# Patient Record
Sex: Female | Born: 1940 | Race: White | Hispanic: No | Marital: Married | Smoking: Never smoker
Health system: Southern US, Community
[De-identification: ages and names within clinical notes are randomized; demographics above are authoritative.]

## PROBLEM LIST (undated history)

## (undated) DIAGNOSIS — R030 Elevated blood-pressure reading, without diagnosis of hypertension: Secondary | ICD-10-CM

## (undated) DIAGNOSIS — G40909 Epilepsy, unspecified, not intractable, without status epilepticus: Secondary | ICD-10-CM

## (undated) DIAGNOSIS — K5900 Constipation, unspecified: Secondary | ICD-10-CM

## (undated) DIAGNOSIS — K829 Disease of gallbladder, unspecified: Secondary | ICD-10-CM

## (undated) DIAGNOSIS — N3281 Overactive bladder: Secondary | ICD-10-CM

## (undated) DIAGNOSIS — M810 Age-related osteoporosis without current pathological fracture: Secondary | ICD-10-CM

## (undated) DIAGNOSIS — J387 Other diseases of larynx: Secondary | ICD-10-CM

## (undated) HISTORY — PX: TONSILLECTOMY: SUR1361

---

## 2008-08-13 HISTORY — PX: LARYNX SURGERY: SHX692

## 2018-07-31 ENCOUNTER — Other Ambulatory Visit: Payer: Self-pay | Admitting: Gastroenterology

## 2018-07-31 ENCOUNTER — Other Ambulatory Visit (HOSPITAL_COMMUNITY): Payer: Self-pay | Admitting: Gastroenterology

## 2018-07-31 DIAGNOSIS — R109 Unspecified abdominal pain: Secondary | ICD-10-CM

## 2018-08-20 ENCOUNTER — Ambulatory Visit (HOSPITAL_COMMUNITY)
Admission: RE | Admit: 2018-08-20 | Discharge: 2018-08-20 | Disposition: A | Discharge status: Home / Self Care | Payer: Medicare Other | Source: Ambulatory Visit | Attending: Gastroenterology | Admitting: Gastroenterology

## 2018-08-20 ENCOUNTER — Encounter (HOSPITAL_COMMUNITY): Payer: Self-pay

## 2018-08-20 DIAGNOSIS — R109 Unspecified abdominal pain: Secondary | ICD-10-CM | POA: Diagnosis present

## 2018-08-20 MED ORDER — TECHNETIUM TC 99M MEBROFENIN IV KIT
5.0000 | PACK | Freq: Once | INTRAVENOUS | Status: AC | PRN
  Administered 2018-08-20: 5 via INTRAVENOUS

## 2018-09-02 NOTE — Progress Notes (Signed)
Please place orders in Epic as this patient is being scheduled for a pre-op appointment! Thank you!

## 2018-09-11 NOTE — Patient Instructions (Addendum)
Michele Levy  09/11/2018   Your procedure is scheduled on: 09-16-2018   Report to [facility] Main  Entrance     Report to admitting at 5:30AM    Call this number if you have problems the morning of surgery [phone]   PLEASE REMEMBER TO BRING COPY OF YOUR LIVING WILL DAY OF SURGERY      Remember: Do not eat food or drink liquids :After Midnight. BRUSH YOUR TEETH MORNING OF SURGERY AND RINSE YOUR MOUTH OUT, NO CHEWING GUM CANDY OR MINTS.     Take these medicines the morning of surgery with A SIP OF WATER: phenytoin,  phenobarbital                                  You may not have any metal on your body including hair pins and              piercings  Do not wear jewelry, make-up, lotions, powders or perfumes, deodorant             Do not wear nail polish.  Do not shave  48 hours prior to surgery.                Do not bring valuables to the hospital. [name] IS NOT             RESPONSIBLE   FOR VALUABLES.  Contacts, dentures or bridgework may not be worn into surgery.      Patients discharged the day of surgery will not be allowed to drive home. IF YOU ARE HAVING SURGERY AND GOING HOME THE SAME DAY, YOU MUST HAVE AN ADULT TO DRIVE YOU HOME AND BE WITH YOU FOR 24 HOURS. YOU MAY GO HOME BY TAXI OR UBER OR ORTHERWISE, BUT AN ADULT MUST ACCOMPANY YOU HOME AND STAY WITH YOU FOR 24 HOURS.  Name and phone number of your driver:  Special Instructions: N/A              Please read over the following fact sheets you were given: _____________________________________________________________________             [facility] - Preparing for Surgery Before surgery, you can play an important role.  Because skin is not sterile, your skin needs to be as free of germs as possible.  You can reduce the number of germs on your skin by washing with CHG (chlorahexidine gluconate) soap before surgery.  CHG is an antiseptic cleaner which kills germs and bonds with  the skin to continue killing germs even after washing. Please DO NOT use if you have an allergy to CHG or antibacterial soaps.  If your skin becomes reddened/irritated stop using the CHG and inform your nurse when you arrive at Short Stay. Do not shave (including legs and underarms) for at least 48 hours prior to the first CHG shower.  You may shave your face/neck. Please follow these instructions carefully:  1.  Shower with CHG Soap the night before surgery and the  morning of Surgery.  2.  If you choose to wash your hair, wash your hair first as usual with your  normal  shampoo.  3.  After you shampoo, rinse your hair and body thoroughly to remove the  shampoo.  4.  Use CHG as you would any other liquid soap.  You can apply chg directly  to the skin and wash                       Gently with a scrungie or clean washcloth.  5.  Apply the CHG Soap to your body ONLY FROM THE NECK DOWN.   Do not use on face/ open                           Wound or open sores. Avoid contact with eyes, ears mouth and genitals (private parts).                       Wash face,  Genitals (private parts) with your normal soap.             6.  Wash thoroughly, paying special attention to the area where your surgery  will be performed.  7.  Thoroughly rinse your body with warm water from the neck down.  8.  DO NOT shower/wash with your normal soap after using and rinsing off  the CHG Soap.                9.  Pat yourself dry with a clean towel.            10.  Wear clean pajamas.            11.  Place clean sheets on your bed the night of your first shower and do not  sleep with pets. Day of Surgery : Do not apply any lotions/deodorants the morning of surgery.  Please wear clean clothes to the hospital/surgery center.  FAILURE TO FOLLOW THESE INSTRUCTIONS MAY RESULT IN THE CANCELLATION OF YOUR SURGERY PATIENT SIGNATURE_________________________________  NURSE  SIGNATURE__________________________________  ________________________________________________________________________

## 2018-09-11 NOTE — Progress Notes (Signed)
Echo 11-26-16 epic care everywhere

## 2018-09-12 ENCOUNTER — Encounter (HOSPITAL_COMMUNITY): Payer: Self-pay | Admitting: Emergency Medicine

## 2018-09-12 ENCOUNTER — Encounter (HOSPITAL_COMMUNITY)
Admission: RE | Admit: 2018-09-12 | Discharge: 2018-09-12 | Disposition: A | Discharge status: Home / Self Care | Payer: Medicare Other | Source: Ambulatory Visit | Attending: Surgery | Admitting: Surgery

## 2018-09-12 ENCOUNTER — Other Ambulatory Visit: Payer: Self-pay

## 2018-09-12 DIAGNOSIS — Z01812 Encounter for preprocedural laboratory examination: Secondary | ICD-10-CM | POA: Diagnosis not present

## 2018-09-12 HISTORY — DX: Overactive bladder: N32.81

## 2018-09-12 HISTORY — DX: Epilepsy, unspecified, not intractable, without status epilepticus: G40.909

## 2018-09-12 HISTORY — DX: Constipation, unspecified: K59.00

## 2018-09-12 HISTORY — DX: Disease of gallbladder, unspecified: K82.9

## 2018-09-12 HISTORY — DX: Age-related osteoporosis without current pathological fracture: M81.0

## 2018-09-12 HISTORY — DX: Elevated blood-pressure reading, without diagnosis of hypertension: R03.0

## 2018-09-12 HISTORY — DX: Other diseases of larynx: J38.7

## 2018-09-12 LAB — CBC
HCT: 42.6 % (ref 36.0–46.0)
Hemoglobin: 13.6 g/dL (ref 12.0–15.0)
MCH: 33.3 pg (ref 26.0–34.0)
MCHC: 31.9 g/dL (ref 30.0–36.0)
MCV: 104.4 fL — ABNORMAL HIGH (ref 80.0–100.0)
Platelets: 171 10*3/uL (ref 150–400)
RBC: 4.08 MIL/uL (ref 3.87–5.11)
RDW: 13.1 % (ref 11.5–15.5)
WBC: 3.2 10*3/uL — ABNORMAL LOW (ref 4.0–10.5)
nRBC: 0 % (ref 0.0–0.2)

## 2018-09-12 LAB — BASIC METABOLIC PANEL
Anion gap: 6 (ref 5–15)
BUN: 18 mg/dL (ref 8–23)
CALCIUM: 9 mg/dL (ref 8.9–10.3)
CO2: 29 mmol/L (ref 22–32)
Chloride: 102 mmol/L (ref 98–111)
Creatinine, Ser: 0.42 mg/dL — ABNORMAL LOW (ref 0.44–1.00)
GFR calc Af Amer: >60 mL/min (ref >60)
GFR calc non Af Amer: >60 mL/min (ref >60)
Glucose, Bld: 95 mg/dL (ref 70–99)
Potassium: 3.9 mmol/L (ref 3.5–5.1)
Sodium: 137 mmol/L (ref 135–145)

## 2018-09-15 NOTE — H&P (Signed)
Chief Complaint:  Abnormal HIDA  History of Present Illness:  Michele Levy is an 78 y.o. female referred by Dr. Rush Farmer with an abnormal HIDA scan.  She has had unexplained RUQ abdominal pain and knows the limitations of cholecystectomy in the face of an abnormal HIDA  Past Medical History:  Diagnosis Date  . Constipation   . Cyst of larynx    left side of larynx , now removed 10 years ago   . Elevated blood pressure reading   . Epilepsy (HCC)   . Gallbladder disorder   . Osteoporosis   . Overactive bladder     Past Surgical History:  Procedure Laterality Date  . LARYNX SURGERY  2010  . TONSILLECTOMY  age 22    No current facility-administered medications for this encounter.    Current Outpatient Medications  Medication Sig Dispense Refill  . Ascorbic Acid (VITAMIN C) 1000 MG tablet Take 1,000 mg by mouth daily.    . Calcium-Magnesium 250-125 MG TABS Take 1 tablet by mouth 2 (two) times daily.    . Cholecalciferol (VITAMIN D) 50 MCG (2000 UT) CAPS Take 2,000 Units by mouth daily.    Marland Kitchen denosumab (PROLIA) 60 MG/ML SOSY injection Inject 60 mg into the skin every 6 (six) months.    . Glucosamine 750 MG TABS Take 1,500 mg by mouth daily.    . mirabegron ER (MYRBETRIQ) 50 MG TB24 tablet Take 50 mg by mouth daily.    . Multiple Vitamin (MULTIVITAMIN WITH MINERALS) TABS tablet Take 1 tablet by mouth daily.    . Omega-3 Fatty Acids (OMEGA 3 PO) Take 690 mg by mouth daily.    Marland Kitchen PHENobarbital (LUMINAL) 32.4 MG tablet Take 32.4 mg by mouth 3 (three) times daily.    . phenytoin (DILANTIN) 100 MG ER capsule Take 100 mg by mouth 3 (three) times daily.    . vitamin B-12 (CYANOCOBALAMIN) 1000 MCG tablet Take 1,000 mcg by mouth daily.     Patient has no known allergies. No family history on file. Social History:   reports that she has never smoked. She has never used smokeless tobacco. She reports previous alcohol use. No history on file for drug.   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 There is no height or weight on file to calculate BMI.  Gen:  WDWN WF NAD  Neurological: Alert and oriented to person, place, and time. Motor and sensory function is grossly intact  Head: Normocephalic and atraumatic.  Eyes: Conjunctivae are normal. Pupils are equal, round, and reactive to light. No scleral icterus.  Neck: Normal range of motion. Neck supple. No tracheal deviation or thyromegaly present.  Cardiovascular:  SR without murmurs or gallops.  No carotid bruits Breast:  Not examined Respiratory: Effort normal.  No respiratory distress. No chest wall tenderness. Breath sounds normal.  No wheezes, rales or rhonchi.  Abdomen:  Bloated but nontender GU:  Not examined Musculoskeletal: Normal range of motion. Extremities are nontender. No cyanosis, edema or clubbing noted Lymphadenopathy: No cervical, preauricular, postauricular or axillary adenopathy is present Skin: Skin is warm and dry. No rash noted. No diaphoresis. No erythema. No pallor. Pscyh: Normal mood and affect. Behavior is normal. Judgment and thought content normal.   LABORATORY RESULTS: No results found for this or any previous visit (from the past 48 hour(s)).   RADIOLOGY RESULTS: No results found.  Problem List:  There are no active problems to display for this patient.   Assessment & Plan: Abnormal HIDA and right upper quadrant pain for lap chole.      Matt B. Daphine Deutscher, MD, [facility] [phone] beeper [phone]  09/15/2018 12:55 PM

## 2018-09-16 ENCOUNTER — Encounter (HOSPITAL_COMMUNITY): Payer: Self-pay

## 2018-09-16 ENCOUNTER — Ambulatory Visit (HOSPITAL_COMMUNITY)
Admission: RE | Admit: 2018-09-16 | Discharge: 2018-09-16 | Disposition: A | Discharge status: Home / Self Care | Payer: Medicare Other | Attending: Surgery | Admitting: Surgery

## 2018-09-16 ENCOUNTER — Encounter (HOSPITAL_COMMUNITY)
Admission: RE | Disposition: A | Discharge status: Home / Self Care | Payer: Self-pay | Source: Home / Self Care | Attending: Surgery

## 2018-09-16 ENCOUNTER — Ambulatory Visit (HOSPITAL_COMMUNITY): Payer: Medicare Other

## 2018-09-16 ENCOUNTER — Ambulatory Visit (HOSPITAL_COMMUNITY): Payer: Medicare Other | Admitting: Anesthesiology

## 2018-09-16 ENCOUNTER — Ambulatory Visit (HOSPITAL_COMMUNITY): Payer: Medicare Other | Admitting: Physician Assistant

## 2018-09-16 DIAGNOSIS — K811 Chronic cholecystitis: Secondary | ICD-10-CM | POA: Insufficient documentation

## 2018-09-16 DIAGNOSIS — N3281 Overactive bladder: Secondary | ICD-10-CM | POA: Diagnosis not present

## 2018-09-16 DIAGNOSIS — Z79899 Other long term (current) drug therapy: Secondary | ICD-10-CM | POA: Insufficient documentation

## 2018-09-16 DIAGNOSIS — R932 Abnormal findings on diagnostic imaging of liver and biliary tract: Secondary | ICD-10-CM | POA: Diagnosis present

## 2018-09-16 DIAGNOSIS — G40909 Epilepsy, unspecified, not intractable, without status epilepticus: Secondary | ICD-10-CM | POA: Diagnosis not present

## 2018-09-16 DIAGNOSIS — M810 Age-related osteoporosis without current pathological fracture: Secondary | ICD-10-CM | POA: Diagnosis not present

## 2018-09-16 DIAGNOSIS — K828 Other specified diseases of gallbladder: Secondary | ICD-10-CM

## 2018-09-16 DIAGNOSIS — Z9049 Acquired absence of other specified parts of digestive tract: Secondary | ICD-10-CM

## 2018-09-16 HISTORY — PX: CHOLECYSTECTOMY: SHX55

## 2018-09-16 SURGERY — LAPAROSCOPIC CHOLECYSTECTOMY WITH INTRAOPERATIVE CHOLANGIOGRAM
Anesthesia: General

## 2018-09-16 MED ORDER — FENTANYL CITRATE (PF) 250 MCG/5ML IJ SOLN
INTRAMUSCULAR | Status: AC
  Filled 2018-09-16: qty 5

## 2018-09-16 MED ORDER — EPHEDRINE 5 MG/ML INJ
INTRAVENOUS | Status: AC
  Filled 2018-09-16: qty 10

## 2018-09-16 MED ORDER — CHLORHEXIDINE GLUCONATE CLOTH 2 % EX PADS: 6.0000 | MEDICATED_PAD | Freq: Once | CUTANEOUS | Status: DC

## 2018-09-16 MED ORDER — OXYCODONE HCL 5 MG PO TABS
ORAL_TABLET | ORAL | Status: AC
  Filled 2018-09-16: qty 1

## 2018-09-16 MED ORDER — DEXAMETHASONE SODIUM PHOSPHATE 10 MG/ML IJ SOLN
INTRAMUSCULAR | Status: AC
  Filled 2018-09-16: qty 1

## 2018-09-16 MED ORDER — HEPARIN SODIUM (PORCINE) 5000 UNIT/ML IJ SOLN
5000.0000 [IU] | Freq: Once | INTRAMUSCULAR | Status: AC
  Administered 2018-09-16: 5000 [IU] via SUBCUTANEOUS
  Filled 2018-09-16: qty 1

## 2018-09-16 MED ORDER — ONDANSETRON HCL 4 MG/2ML IJ SOLN
INTRAMUSCULAR | Status: DC | PRN
  Administered 2018-09-16: 4 mg via INTRAVENOUS

## 2018-09-16 MED ORDER — PHENYLEPHRINE HCL 10 MG/ML IJ SOLN
INTRAMUSCULAR | Status: AC
  Filled 2018-09-16: qty 1

## 2018-09-16 MED ORDER — HYDROCODONE-ACETAMINOPHEN 5-325 MG PO TABS: 1.0000 | ORAL_TABLET | Freq: Four times a day (QID) | ORAL | 0 refills | Status: AC | PRN

## 2018-09-16 MED ORDER — KETOROLAC TROMETHAMINE 15 MG/ML IJ SOLN
15.0000 mg | Freq: Once | INTRAMUSCULAR | Status: AC | PRN
  Administered 2018-09-16: 15 mg via INTRAVENOUS

## 2018-09-16 MED ORDER — BUPIVACAINE LIPOSOME 1.3 % IJ SUSP
INTRAMUSCULAR | Status: DC | PRN
  Administered 2018-09-16: 20 mL

## 2018-09-16 MED ORDER — LACTATED RINGERS IV SOLN
INTRAVENOUS | Status: DC
  Administered 2018-09-16 (×2): via INTRAVENOUS

## 2018-09-16 MED ORDER — ROCURONIUM BROMIDE 100 MG/10ML IV SOLN
INTRAVENOUS | Status: DC | PRN
  Administered 2018-09-16: 10 mg via INTRAVENOUS
  Administered 2018-09-16: 50 mg via INTRAVENOUS
  Administered 2018-09-16: 10 mg via INTRAVENOUS

## 2018-09-16 MED ORDER — ONDANSETRON HCL 4 MG/2ML IJ SOLN
INTRAMUSCULAR | Status: AC
  Filled 2018-09-16: qty 2

## 2018-09-16 MED ORDER — FENTANYL CITRATE (PF) 100 MCG/2ML IJ SOLN
INTRAMUSCULAR | Status: AC
  Administered 2018-09-16: 25 ug via INTRAVENOUS
  Filled 2018-09-16: qty 2

## 2018-09-16 MED ORDER — IOPAMIDOL (ISOVUE-300) INJECTION 61%
INTRAVENOUS | Status: DC | PRN
  Administered 2018-09-16: 7 mL

## 2018-09-16 MED ORDER — OXYCODONE HCL 5 MG PO TABS
5.0000 mg | ORAL_TABLET | Freq: Once | ORAL | Status: AC | PRN
  Administered 2018-09-16: 5 mg via ORAL

## 2018-09-16 MED ORDER — IOPAMIDOL (ISOVUE-300) INJECTION 61%
INTRAVENOUS | Status: AC
  Filled 2018-09-16: qty 50

## 2018-09-16 MED ORDER — PROMETHAZINE HCL 25 MG/ML IJ SOLN: 6.2500 mg | INTRAMUSCULAR | Status: DC | PRN

## 2018-09-16 MED ORDER — LIDOCAINE 2% (20 MG/ML) 5 ML SYRINGE
INTRAMUSCULAR | Status: AC
  Filled 2018-09-16: qty 5

## 2018-09-16 MED ORDER — CEFAZOLIN SODIUM-DEXTROSE 2-4 GM/100ML-% IV SOLN
2.0000 g | INTRAVENOUS | Status: AC
  Administered 2018-09-16: 2 g via INTRAVENOUS
  Filled 2018-09-16: qty 100

## 2018-09-16 MED ORDER — OXYCODONE HCL 5 MG/5ML PO SOLN
5.0000 mg | Freq: Once | ORAL | Status: AC | PRN
  Filled 2018-09-16: qty 5

## 2018-09-16 MED ORDER — LACTATED RINGERS IR SOLN
Status: DC | PRN
  Administered 2018-09-16: 1000 mL

## 2018-09-16 MED ORDER — SUGAMMADEX SODIUM 200 MG/2ML IV SOLN
INTRAVENOUS | Status: AC
  Filled 2018-09-16: qty 2

## 2018-09-16 MED ORDER — KETOROLAC TROMETHAMINE 30 MG/ML IJ SOLN: 15.0000 mg | Freq: Once | INTRAMUSCULAR | Status: DC | PRN

## 2018-09-16 MED ORDER — LIDOCAINE HCL (CARDIAC) PF 100 MG/5ML IV SOSY
PREFILLED_SYRINGE | INTRAVENOUS | Status: DC | PRN
  Administered 2018-09-16: 60 mg via INTRAVENOUS

## 2018-09-16 MED ORDER — FENTANYL CITRATE (PF) 100 MCG/2ML IJ SOLN
25.0000 ug | INTRAMUSCULAR | Status: DC | PRN
  Administered 2018-09-16 (×3): 25 ug via INTRAVENOUS

## 2018-09-16 MED ORDER — BUPIVACAINE LIPOSOME 1.3 % IJ SUSP
20.0000 mL | Freq: Once | INTRAMUSCULAR | Status: DC
  Filled 2018-09-16: qty 20

## 2018-09-16 MED ORDER — 0.9 % SODIUM CHLORIDE (POUR BTL) OPTIME
TOPICAL | Status: DC | PRN
  Administered 2018-09-16: 500 mL

## 2018-09-16 MED ORDER — DEXAMETHASONE SODIUM PHOSPHATE 10 MG/ML IJ SOLN
INTRAMUSCULAR | Status: DC | PRN
  Administered 2018-09-16: 10 mg via INTRAVENOUS

## 2018-09-16 MED ORDER — SUGAMMADEX SODIUM 200 MG/2ML IV SOLN
INTRAVENOUS | Status: DC | PRN
  Administered 2018-09-16: 125 mg via INTRAVENOUS

## 2018-09-16 MED ORDER — EPHEDRINE SULFATE 50 MG/ML IJ SOLN
INTRAMUSCULAR | Status: DC | PRN
  Administered 2018-09-16: 5 mg via INTRAVENOUS

## 2018-09-16 MED ORDER — KETOROLAC TROMETHAMINE 15 MG/ML IJ SOLN
INTRAMUSCULAR | Status: AC
  Filled 2018-09-16: qty 1

## 2018-09-16 MED ORDER — PROPOFOL 10 MG/ML IV BOLUS
INTRAVENOUS | Status: AC
  Filled 2018-09-16: qty 20

## 2018-09-16 MED ORDER — ROCURONIUM BROMIDE 100 MG/10ML IV SOLN
INTRAVENOUS | Status: AC
  Filled 2018-09-16: qty 1

## 2018-09-16 MED ORDER — PROPOFOL 10 MG/ML IV BOLUS
INTRAVENOUS | Status: DC | PRN
  Administered 2018-09-16: 100 mg via INTRAVENOUS

## 2018-09-16 MED ORDER — FENTANYL CITRATE (PF) 100 MCG/2ML IJ SOLN
INTRAMUSCULAR | Status: DC | PRN
  Administered 2018-09-16 (×2): 75 ug via INTRAVENOUS

## 2018-09-16 SURGICAL SUPPLY — 38 items
APPLICATOR COTTON TIP 6 STRL (MISCELLANEOUS) ×2 IMPLANT
APPLICATOR COTTON TIP 6IN STRL (MISCELLANEOUS) ×6
APPLIER CLIP ROT 10 11.4 M/L (STAPLE) ×3
BENZOIN TINCTURE PRP APPL 2/3 (GAUZE/BANDAGES/DRESSINGS) IMPLANT
CABLE HIGH FREQUENCY MONO STRZ (ELECTRODE) IMPLANT
CATH REDDICK CHOLANGI 4FR 50CM (CATHETERS) ×3 IMPLANT
CLIP APPLIE ROT 10 11.4 M/L (STAPLE) ×1 IMPLANT
CLOSURE WOUND 1/2 X4 (GAUZE/BANDAGES/DRESSINGS)
COVER MAYO STAND STRL (DRAPES) ×3 IMPLANT
COVER SURGICAL LIGHT HANDLE (MISCELLANEOUS) ×3 IMPLANT
COVER WAND RF STERILE (DRAPES) ×3 IMPLANT
DECANTER SPIKE VIAL GLASS SM (MISCELLANEOUS) ×3 IMPLANT
DERMABOND ADVANCED (GAUZE/BANDAGES/DRESSINGS) ×2
DERMABOND ADVANCED .7 DNX12 (GAUZE/BANDAGES/DRESSINGS) ×1 IMPLANT
DRAPE C-ARM 42X120 X-RAY (DRAPES) ×3 IMPLANT
ELECT PENCIL ROCKER SW 15FT (MISCELLANEOUS) ×3 IMPLANT
ELECT REM PT RETURN 15FT ADLT (MISCELLANEOUS) ×3 IMPLANT
GLOVE BIOGEL M 8.0 STRL (GLOVE) ×3 IMPLANT
GOWN STRL REUS W/TWL XL LVL3 (GOWN DISPOSABLE) ×9 IMPLANT
HEMOSTAT SURGICEL 4X8 (HEMOSTASIS) IMPLANT
IV CATH 14GX2 1/4 (CATHETERS) ×3 IMPLANT
KIT BASIN OR (CUSTOM PROCEDURE TRAY) ×3 IMPLANT
L-HOOK LAP DISP 36CM (ELECTROSURGICAL)
LHOOK LAP DISP 36CM (ELECTROSURGICAL) IMPLANT
POUCH RETRIEVAL ECOSAC 10 (ENDOMECHANICALS) ×1 IMPLANT
POUCH RETRIEVAL ECOSAC 10MM (ENDOMECHANICALS) ×2
SCISSORS LAP 5X45 EPIX DISP (ENDOMECHANICALS) ×3 IMPLANT
SET IRRIG TUBING LAPAROSCOPIC (IRRIGATION / IRRIGATOR) ×3 IMPLANT
SET TUBE SMOKE EVAC HIGH FLOW (TUBING) ×3 IMPLANT
SLEEVE XCEL OPT CAN 5 100 (ENDOMECHANICALS) ×6 IMPLANT
STRIP CLOSURE SKIN 1/2X4 (GAUZE/BANDAGES/DRESSINGS) IMPLANT
SUT MNCRL AB 4-0 PS2 18 (SUTURE) ×3 IMPLANT
SYR 20CC LL (SYRINGE) ×3 IMPLANT
TOWEL OR 17X26 10 PK STRL BLUE (TOWEL DISPOSABLE) ×3 IMPLANT
TRAY LAPAROSCOPIC (CUSTOM PROCEDURE TRAY) ×3 IMPLANT
TROCAR BLADELESS OPT 5 100 (ENDOMECHANICALS) ×3 IMPLANT
TROCAR XCEL BLUNT TIP 100MML (ENDOMECHANICALS) IMPLANT
TROCAR XCEL NON-BLD 11X100MML (ENDOMECHANICALS) ×3 IMPLANT

## 2018-09-16 NOTE — Anesthesia Preprocedure Evaluation (Addendum)
Anesthesia Evaluation  Patient identified by MRN, date of birth, ID band Patient awake    Reviewed: Allergy & Precautions, NPO status , Patient's Chart, lab work & pertinent test results  Airway Mallampati: II  TM Distance: >3 FB Neck ROM: Full    Dental no notable dental hx.    Pulmonary neg pulmonary ROS,    Pulmonary exam normal breath sounds clear to auscultation       Cardiovascular negative cardio ROS Normal cardiovascular exam Rhythm:Regular Rate:Normal     Neuro/Psych Seizures -, Well Controlled,  negative psych ROS   GI/Hepatic negative GI ROS, Neg liver ROS,   Endo/Other  negative endocrine ROS  Renal/GU negative Renal ROS  negative genitourinary   Musculoskeletal negative musculoskeletal ROS (+)   Abdominal   Peds negative pediatric ROS (+)  Hematology negative hematology ROS (+)   Anesthesia Other Findings   Reproductive/Obstetrics negative OB ROS                             Anesthesia Physical Anesthesia Plan  ASA: II  Anesthesia Plan: General   Post-op Pain Management:    Induction: Intravenous  PONV Risk Score and Plan: 3 and Ondansetron, Dexamethasone and Treatment may vary due to age or medical condition  Airway Management Planned: Oral ETT  Additional Equipment:   Intra-op Plan:   Post-operative Plan: Extubation in OR  Informed Consent: I have reviewed the patients History and Physical, chart, labs and discussed the procedure including the risks, benefits and alternatives for the proposed anesthesia with the patient or authorized representative who has indicated his/her understanding and acceptance.     Dental advisory given  Plan Discussed with: CRNA and Surgeon  Anesthesia Plan Comments:         Anesthesia Quick Evaluation

## 2018-09-16 NOTE — Interval H&P Note (Signed)
History and Physical Interval Note:  09/16/2018 7:23 AM  Michele Levy  has presented today for surgery, with the diagnosis of BILIARY DYSKINESIA  The various methods of treatment have been discussed with the patient and family. After consideration of risks, benefits and other options for treatment, the patient has consented to  Procedure(s): LAPAROSCOPIC CHOLECYSTECTOMY WITH INTRAOPERATIVE CHOLANGIOGRAM (N/A) as a surgical intervention .  The patient's history has been reviewed, patient examined, no change in status, stable for surgery.  I have reviewed the patient's chart and labs.  Questions were answered to the patient's satisfaction.     Valarie Merino

## 2018-09-16 NOTE — Op Note (Signed)
Michele Levy  Primary Care Physician:  Remi Haggard, MD    09/16/2018  9:04 AM  Procedure: Laparoscopic Cholecystectomy with intraoperative cholangiogram  Surgeon: Susy Frizzle B. Daphine Deutscher, MD, FACS Asst:  none  Anes:  General  Drains:  None  Findings: Large floppy gallbladder without stones; dilated biliary tree including cystic duct on cholangiogram  Description of Procedure: The patient was taken to OR 2 and given general anesthesia.  The patient was prepped with PCMX and draped sterilely. A time out was performed.  Access to the abdomen was achieved with a 5 mm Optiview through the right upper quadrant.  Port placement included three 5 mm and one 11 mm in the upper midline.    The gallbladder was visualized and the fundus was grasped and the gallbladder was elevated. Traction on the infundibulum allowed for successful demonstration of the critical view. Inflammatory changes were chronic and minimal.  The gallbladder was floppy.  The hepatic flexure was above the liver laterally and visible.  The cystic duct was identified and clipped up on the gallbladder and an incision was made in the cystic duct and the Reddick catheter was inserted after milking the cystic duct of any debris. A dynamic cholangiogram was performed which demonstrated a large long cystic duct; flow into the duodenum and retroflow into a large common bile duct.  .    The cystic duct was then triple clipped and divided, the cystic artery was double clipped and divided and then the gallbladder was removed from the gallbladder bed. Removal of the gallbladder from the gallbladder bed was performed without entering it.  The gallbladder was then placed in a bag and brought out through one of the trocar sites. The gallbladder bed was inspected and no bleeding or bile leaks were seen.   Laparoscopic visualization was used when closing fascial defects for trocar sites with a 0 vicryl anteriorly.   Incisions were injected with  Exparel and closed with 4-0 Monocryl and Dermabond on the skin.  Sponge and needle count were correct.    The patient was taken to the recovery room in satisfactory condition.

## 2018-09-16 NOTE — Discharge Instructions (Signed)
Biliary Dyskinesia  Biliary dyskinesia is a condition in which the gallbladder or bile ducts cannot release or move bile normally. Bile is a fluid that is made in the liver. It helps the body to digest food. Bile flows to the gallbladder to be stored. When bile is needed for digestion, it leaves the gallbladder and flows through the bile ducts into the digestive tract. Biliary dyskinesia causes bile to build up, and that can cause abdomen (abdominal)pain. This condition may also be called:  Acalculous cholecystopathy.  Functional gallbladder disorder.  Sphincter of Oddi dysfunction. All these names describe gallbladder disorders that are not caused by gallbladder stones. Sphincter of Oddi dysfunction happens in the area where the duct for digestive juices from the pancreas (pancreatic duct) joins the bile duct before emptying into the digestive tract. If the opening into the digestive tract is too small, bile and pancreatic juices may back up and cause abdominal pain. What are the causes? The cause of biliary dyskinesia is poor function of the gallbladder. The exact reason this happens is often unknown. One reason may be changes in the gallbladder that are caused by obesity. What increases the risk? You are more likely to develop this condition if your mother or father had it, or if you are:  Overweight.  Female.  40-60 years old. What are the signs or symptoms? The main symptom of this condition is pain in the upper right side of the abdomen. Typically, the pain:  Starts about 30 minutes after a meal, especially a meal that is spicy or greasy.  Lasts for 30 minutes or longer.  Builds up gradually until it is a steady pain that is severe enough to interrupt daily activities. Other symptoms may include:  Nausea.  Vomiting.  Cramping.  Bloating.  Heartburn.  Diarrhea. How is this diagnosed? This condition may be diagnosed based on your symptoms, your medical history, and a  physical exam. You may have tests to rule out other conditions and to confirm the diagnosis. These may include:  Blood tests.  Ultrasound tests of the gallbladder.  HIDA scan. This is an X-ray test that can show if your gallbladder empties less than a normal amount of bile (gallbladder ejection fraction).  MRI or CT scan of the abdomen.  ERCP (endoscopic retrograde cholangiopancreatogram). During this procedure, a thin tube with a camera on the end is inserted into the throat and down into areas that need examination, such as the pancreas, bile ducts, liver, and gallbladder. Dye is injected into your blood, then X-rays are done. The dye helps your health care provider to see the areas that need examination. ERCP may be done to help diagnose sphincter of Oddi dysfunction. How is this treated? Treatment depends on the cause. The first step of treatment is usually to make lifestyle changes. Your health care provider may recommend:  Taking over-the-counter or prescription pain medicine.  Resting.  Losing weight.  Avoiding foods that are spicy or greasy. In some cases, the condition gets better with lifestyle changes only. However, in many cases, surgery to remove the gallbladder (cholecystectomy) is necessary. Follow these instructions at home:  Take over-the-counter and prescription medicines only as told by your health care provider.  Do not drive or use heavy machinery while taking prescription pain medicine.  If you are taking prescription pain medicine, take actions to prevent or treat constipation. Your health care provider may recommend that you: ? Drink enough fluid to keep your urine pale yellow. ? Eat foods that   are high in fiber, such as fresh fruits and vegetables, whole grains, and beans. ? Limit foods that are high in fat and processed sugars, such as fried or sweet foods. ? Take an over-the-counter or prescription medicine for constipation.  Rest and return to your normal  activities as told by your health care provider. Ask your health care provider what activities are safe for you.  Follow instructions from your health care provider about eating or drinking restrictions. You may need to limit fatty, greasy, and spicy foods if they cause symptoms.  Limit alcohol intake to no more than 1 drink a day for nonpregnant women and 2 drinks a day for men. One drink equals 12 oz of beer, 5 oz of wine, or 1 oz of hard liquor. Alcohol can irritate your stomach and your liver.  Do not use any products that contain nicotine or tobacco, such as cigarettes and e-cigarettes. If you need help quitting, ask your health care provider. Smoking can damage your digestive system.  Keep all follow-up visits as told by your health care provider. This is important. Contact a health care provider if:  Your abdominal pain comes back.  You have any of the following: ? Nausea. ? Vomiting. ? Diarrhea. ? Cramping. ? Bloating. ? Diarrhea. Summary  Biliary dyskinesia is a condition in which your gallbladder or bile ducts cannot release or move bile normally.  The main symptom of this condition is pain in the upper right side of the abdomen. The pain typically starts about 30 minutes after a meal, especially a meal that is spicy or greasy.  Treatment may include lifestyle changes, such as working to lose weight and avoiding certain foods. In many cases, surgery to remove the gallbladder (cholecystectomy) is necessary. This information is not intended to replace advice given to you by your health care provider. Make sure you discuss any questions you have with your health care provider. Document Released: 05/02/2017 Document Revised: 05/02/2017 Document Reviewed: 05/02/2017 Elsevier Interactive Patient Education  2019 [employer].

## 2018-09-16 NOTE — Anesthesia Procedure Notes (Signed)
Procedure Name: Intubation Date/Time: 09/16/2018 7:37 AM Performed by: Thornell Mule, CRNA Pre-anesthesia Checklist: Patient identified, Emergency Drugs available, Suction available and Patient being monitored Patient Re-evaluated:Patient Re-evaluated prior to induction Oxygen Delivery Method: Circle system utilized Preoxygenation: Pre-oxygenation with 100% oxygen Induction Type: IV induction Ventilation: Mask ventilation without difficulty Laryngoscope Size: Miller and 3 Grade View: Grade I Tube type: Oral Tube size: 7.0 mm Number of attempts: 1 Airway Equipment and Method: Stylet and Oral airway Placement Confirmation: ETT inserted through vocal cords under direct vision,  positive ETCO2 and breath sounds checked- equal and bilateral Secured at: 20 cm Tube secured with: Tape Dental Injury: Teeth and Oropharynx as per pre-operative assessment

## 2018-09-16 NOTE — Transfer of Care (Signed)
Immediate Anesthesia Transfer of Care Note  Patient: Michele Levy  Procedure(s) Performed: LAPAROSCOPIC CHOLECYSTECTOMY WITH INTRAOPERATIVE CHOLANGIOGRAM (N/A )  Patient Location: PACU  Anesthesia Type:General  Level of Consciousness: awake, alert  and oriented  Airway & Oxygen Therapy: Patient Spontanous Breathing and Patient connected to face mask oxygen  Post-op Assessment: Report given to RN and Post -op Vital signs reviewed and stable  Post vital signs: Reviewed and stable  Last Vitals:  Vitals Value Taken Time  BP 160/77 09/16/2018  9:15 AM  Temp    Pulse 63 09/16/2018  9:16 AM  Resp 11 09/16/2018  9:16 AM  SpO2 100 % 09/16/2018  9:16 AM  Vitals shown include unvalidated device data.  Last Pain:  Vitals:   09/16/18 0558  TempSrc: Oral  PainSc:          Complications: No apparent anesthesia complications

## 2018-09-16 NOTE — Anesthesia Postprocedure Evaluation (Signed)
Anesthesia Post Note  Patient: [name]  Procedure(s) Performed: LAPAROSCOPIC CHOLECYSTECTOMY WITH INTRAOPERATIVE CHOLANGIOGRAM (N/A )     Patient location during evaluation: PACU Anesthesia Type: General Level of consciousness: awake and alert Pain management: pain level controlled Vital Signs Assessment: post-procedure vital signs reviewed and stable Respiratory status: spontaneous breathing, nonlabored ventilation, respiratory function stable and patient connected to nasal cannula oxygen Cardiovascular status: blood pressure returned to baseline and stable Postop Assessment: no apparent nausea or vomiting Anesthetic complications: no    Last Vitals:  Vitals:   09/16/18 1000 09/16/18 1017  BP: 132/65 (!) 145/77  Pulse: 65 68  Resp: 12 15  Temp: (!) 36.2 C (!) 36.4 C  SpO2: 95% 95%    Last Pain:  Vitals:   09/16/18 1000  TempSrc:   PainSc: 3                  Michele Levy S

## 2018-09-17 ENCOUNTER — Encounter (HOSPITAL_COMMUNITY): Payer: Self-pay | Admitting: Surgery

## 2020-10-17 IMAGING — NM NM HEPATO W/GB/PHARM/[PERSON_NAME]
4 series · 19 of 19 positions shown · non-contrast
Comparison: None.

CLINICAL DATA: Abdominal bloating and nausea

EXAM:
NUCLEAR MEDICINE HEPATOBILIARY IMAGING WITH GALLBLADDER EF
VIEWS:
Anterior right upper quadrant
RADIOPHARMACEUTICALS:  5.3 mCi [id number]  Choletec IV

[Series 1: dynamic - (id) - gbef - hida_motion_corrected moti · 4.14mm/px · 6 of 60 frames shown]
[frame 6/60]
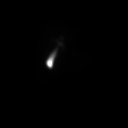
[frame 16/60]
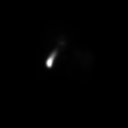
[frame 26/60]
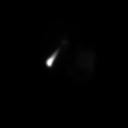
[frame 36/60]
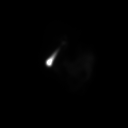
[frame 46/60]
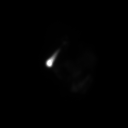
[frame 56/60]
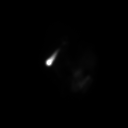

[Series 1: biliary · 4.14mm/px · 6 of 60 frames shown]
[frame 6/60]
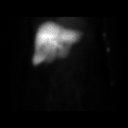
[frame 16/60]
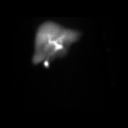
[frame 26/60]
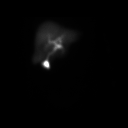
[frame 36/60]
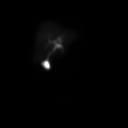
[frame 46/60]
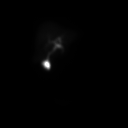
[frame 56/60]
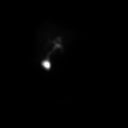

[Series 2: rt lat · 4.14mm/px · 1 of 1 slices shown]
[im 1/1]
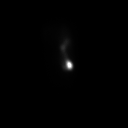

[Series 4: gbef · 4.14mm/px · 6 of 60 frames shown]
[frame 6/60]
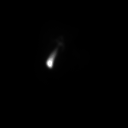
[frame 16/60]
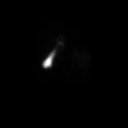
[frame 26/60]
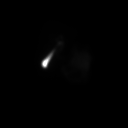
[frame 36/60]
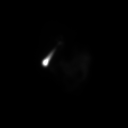
[frame 46/60]
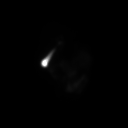
[frame 56/60]
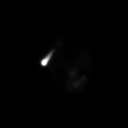

[19 of 19 positions shown; findings below may reference images not displayed]

FINDINGS: Liver uptake of radiotracer is unremarkable. There is prompt
visualization of gallbladder and small bowel, indicating patency of
the cystic and common bile ducts. Patient consumed 8 ounces of
Ensure orally with calculation of the computer generated ejection
fraction of radiotracer from the gallbladder. The patient did not
experience clinical symptoms with the oral Ensure consumption. The
computer generated ejection fraction of radiotracer from the
gallbladder is abnormally low at 28%, normal greater than 33% using
the oral agent.
IMPRESSION: Abnormally low ejection fraction of radiotracer, a finding that
likely is indicative of biliary dyskinesia. Patient did not
experience clinical symptoms with the oral Ensure consumption.
Cystic and common bile ducts are patent as is evidenced by
visualization of gallbladder and small bowel.

## 2020-11-13 IMAGING — RF DG CHOLANGIOGRAM OPERATIVE
1 series · 8 of 8 positions shown · non-contrast
Comparison: Nuclear medicine HIDA scan with gallbladder ejection
fraction-08/20/2018

CLINICAL DATA: Intraoperative cholangiogram during laparoscopic
cholecystectomy.

EXAM:
INTRAOPERATIVE CHOLANGIOGRAM
FLUOROSCOPY TIME:  58 seconds

[Series 1: run · 2 acquisitions, 8 frames shown]
[im 1/2]
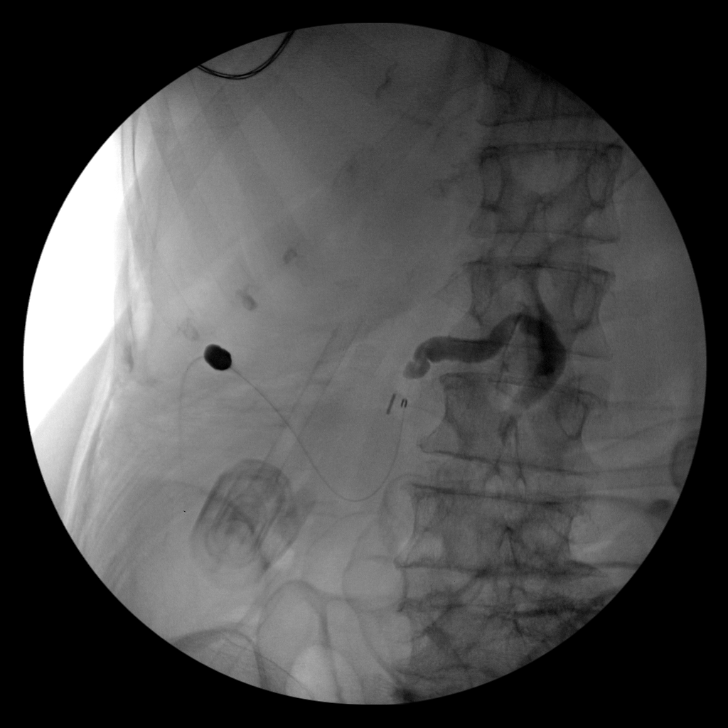
[im 1/2]
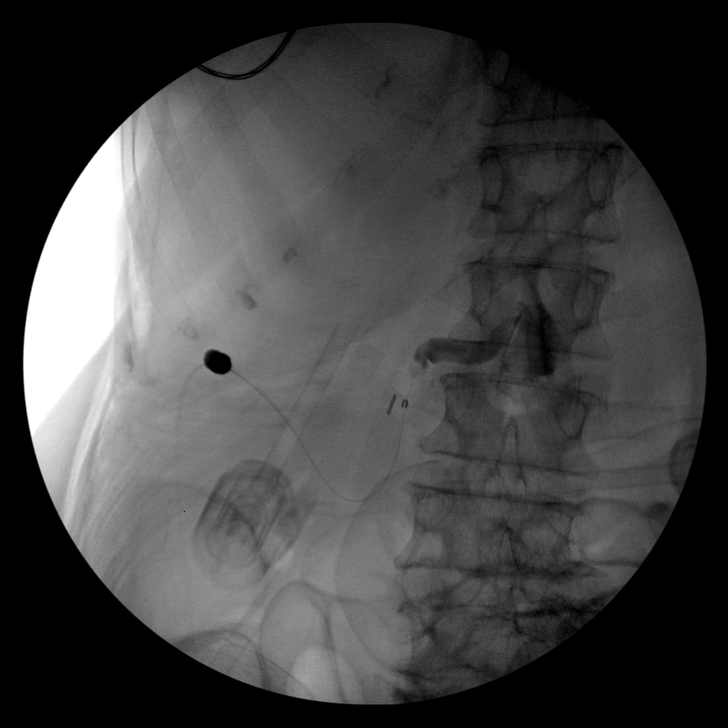
[im 1/2]
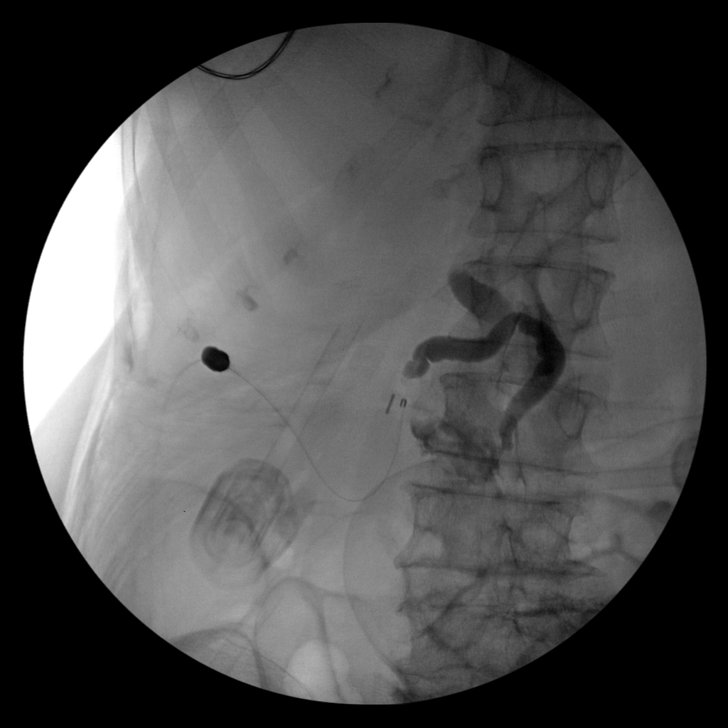
[im 1/2]
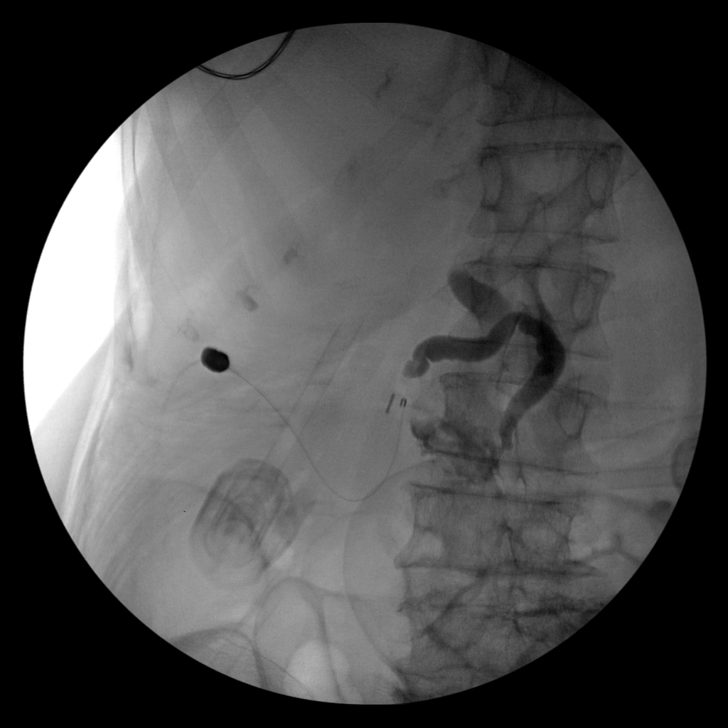
[im 2/2]
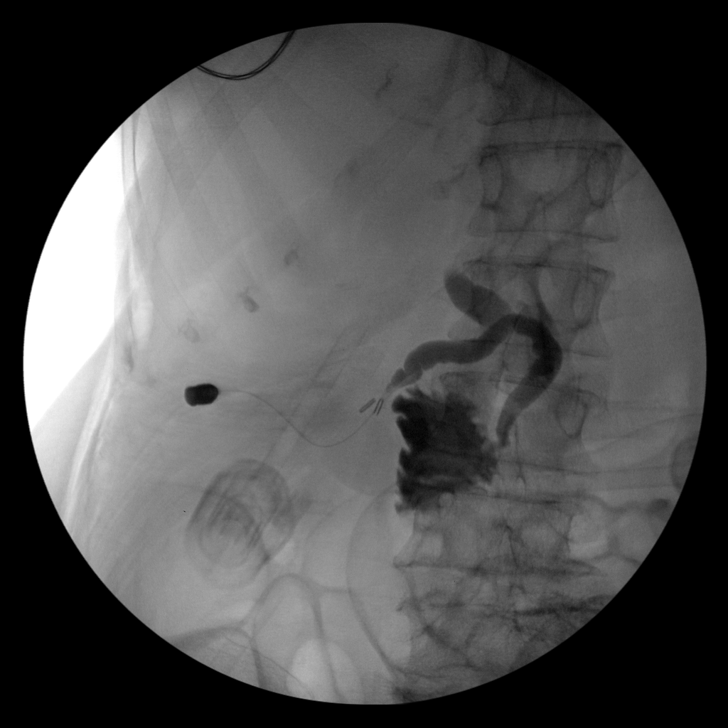
[im 2/2]
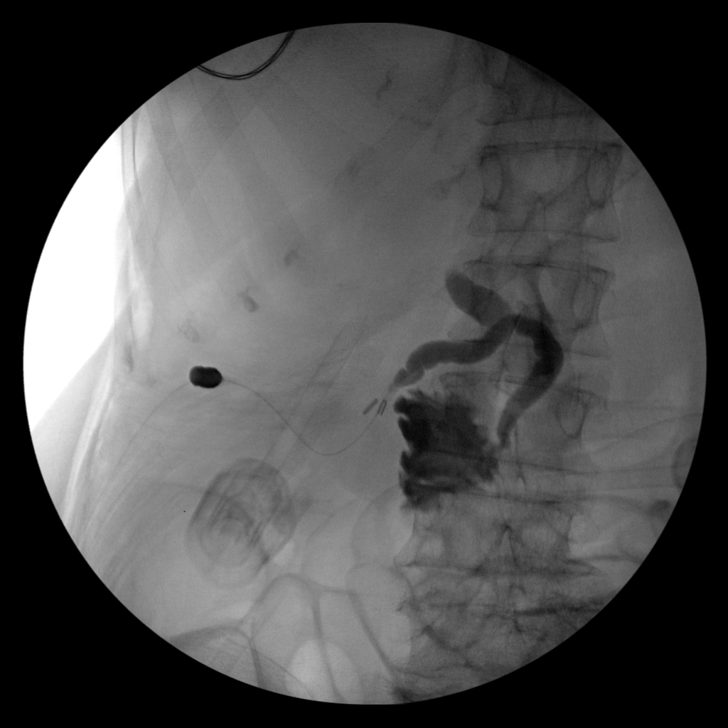
[im 2/2]
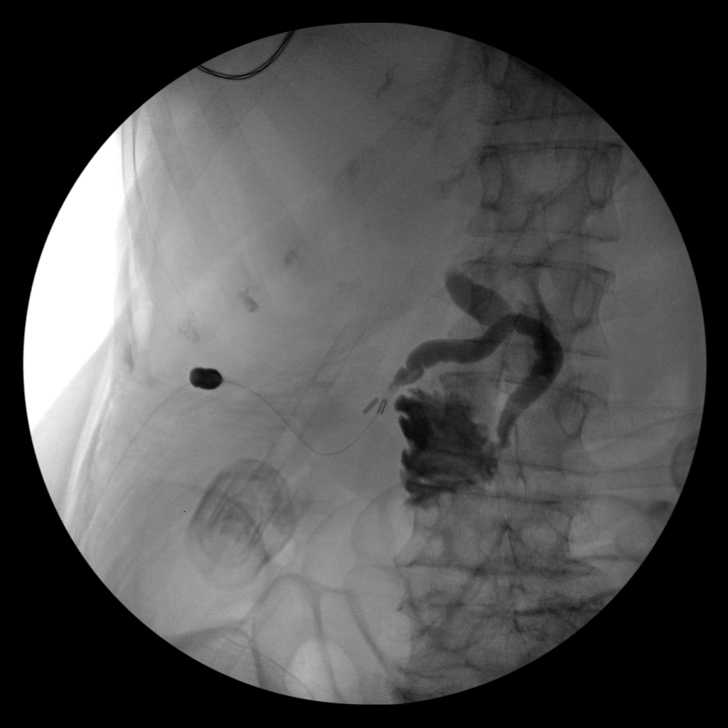
[im 2/2  full-range]
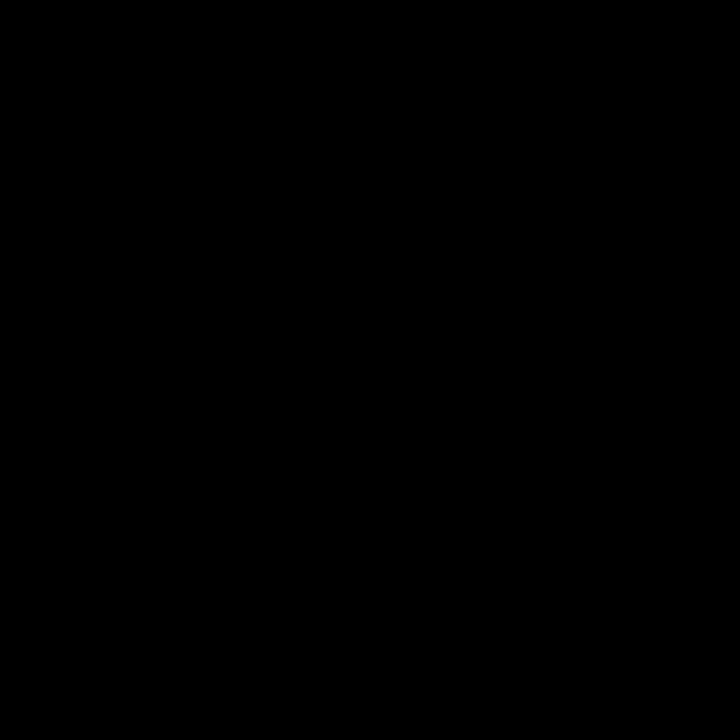

[8 of 8 positions shown; findings below may reference images not displayed]

FINDINGS: Intraoperative cholangiographic images of the right upper abdominal
quadrant during laparoscopic cholecystectomy are provided for
review.

Surgical clips overlie the expected location of the gallbladder
fossa.

Contrast injection demonstrates selective cannulation of the central
aspect of the cystic duct.

There is passage of contrast through the central aspect of the
cystic duct with filling of a non dilated common bile duct. There is
passage of contrast though the CBD and into the descending portion
of the duodenum.

There is minimal reflux of injected contrast into the common hepatic
duct and central aspect of the non dilated intrahepatic biliary
system.

There are no discrete filling defects within the opacified portions
of the biliary system to suggest the presence of
choledocholithiasis.
IMPRESSION: No evidence of choledocholithiasis.
